# Patient Record
Sex: Male | Born: 1985 | Hispanic: No | Marital: Single | Smoking: Never smoker
Health system: Southern US, Community
[De-identification: ages and names within clinical notes are randomized; demographics above are authoritative.]

---
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
## 2010-07-29 ENCOUNTER — Emergency Department (HOSPITAL_COMMUNITY): Payer: Self-pay

## 2010-07-29 ENCOUNTER — Encounter (HOSPITAL_COMMUNITY): Payer: Self-pay

## 2010-07-29 ENCOUNTER — Emergency Department (HOSPITAL_COMMUNITY)
Admission: EM | Admit: 2010-07-29 | Discharge: 2010-07-29 | Disposition: A | Discharge status: Home / Self Care | Payer: Self-pay | Attending: Emergency Medicine | Admitting: Emergency Medicine

## 2010-07-29 DIAGNOSIS — M25519 Pain in unspecified shoulder: Secondary | ICD-10-CM | POA: Insufficient documentation

## 2010-07-29 DIAGNOSIS — S42009A Fracture of unspecified part of unspecified clavicle, initial encounter for closed fracture: Secondary | ICD-10-CM | POA: Insufficient documentation

## 2012-02-09 ENCOUNTER — Emergency Department (HOSPITAL_COMMUNITY)
Admission: EM | Admit: 2012-02-09 | Discharge: 2012-02-09 | Disposition: A | Discharge status: Home / Self Care | Payer: No Typology Code available for payment source | Attending: Emergency Medicine | Admitting: Emergency Medicine

## 2012-02-09 ENCOUNTER — Encounter (HOSPITAL_COMMUNITY): Payer: Self-pay | Admitting: Emergency Medicine

## 2012-02-09 DIAGNOSIS — S20219A Contusion of unspecified front wall of thorax, initial encounter: Secondary | ICD-10-CM | POA: Insufficient documentation

## 2012-02-09 DIAGNOSIS — Y92410 Unspecified street and highway as the place of occurrence of the external cause: Secondary | ICD-10-CM | POA: Insufficient documentation

## 2012-02-09 DIAGNOSIS — T148XXA Other injury of unspecified body region, initial encounter: Secondary | ICD-10-CM | POA: Insufficient documentation
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
## 2013-08-13 ENCOUNTER — Encounter (HOSPITAL_COMMUNITY): Payer: Self-pay | Admitting: Emergency Medicine

## 2013-08-13 ENCOUNTER — Emergency Department (HOSPITAL_COMMUNITY): Payer: 59

## 2013-08-13 ENCOUNTER — Emergency Department (HOSPITAL_COMMUNITY)
Admission: EM | Admit: 2013-08-13 | Discharge: 2013-08-14 | Disposition: A | Discharge status: Home / Self Care | Payer: 59 | Attending: Emergency Medicine | Admitting: Emergency Medicine

## 2013-08-13 DIAGNOSIS — Y9389 Activity, other specified: Secondary | ICD-10-CM | POA: Insufficient documentation

## 2013-08-13 DIAGNOSIS — Y990 Civilian activity done for income or pay: Secondary | ICD-10-CM | POA: Insufficient documentation

## 2013-08-13 DIAGNOSIS — W230XXA Caught, crushed, jammed, or pinched between moving objects, initial encounter: Secondary | ICD-10-CM | POA: Insufficient documentation

## 2013-08-13 DIAGNOSIS — S6000XA Contusion of unspecified finger without damage to nail, initial encounter: Secondary | ICD-10-CM | POA: Insufficient documentation

## 2013-08-13 DIAGNOSIS — S60011A Contusion of right thumb without damage to nail, initial encounter: Secondary | ICD-10-CM

## 2013-08-13 DIAGNOSIS — W268XXA Contact with other sharp object(s), not elsewhere classified, initial encounter: Secondary | ICD-10-CM | POA: Insufficient documentation

## 2013-08-13 DIAGNOSIS — Y9289 Other specified places as the place of occurrence of the external cause: Secondary | ICD-10-CM | POA: Insufficient documentation

## 2013-08-13 DIAGNOSIS — S61209A Unspecified open wound of unspecified finger without damage to nail, initial encounter: Secondary | ICD-10-CM | POA: Insufficient documentation

## 2013-08-13 NOTE — ED Provider Notes (Signed)
CSN: [id number]     Arrival date & time 08/13/13  2154 History  This chart was scribed for non-physician practitioner working with Audree CamelScott T Goldston, MD by Elveria Risingimelie Horne, ED Scribe. This patient was seen in room WTR6/WTR6 and the patient's care was started at 11:20 PM.   Chief Complaint  Patient presents with  . Finger Injury      The history is provided by the patient. No language interpreter was used.   HPI Comments: Henry Armstrong is a 28 y.o. male who presents to the Emergency Department with a right thumb injury. Patient reports smashing his thumb with a pry bar while at work yesterday. Patient says that he has not taken any medication, but he iced the finger while at work today. Patient and girlfriend report several attempts to drain blood from the nail w/ sterilized needle, but the blood under surface continued to reappear and at greater amounts. The finger is now swollen and painful. Patient says he feels pressure when naturally hanging his arm. Patient also reports numbness and tingling in the finger.  No medical issues.  History reviewed. No pertinent past medical history. History reviewed. No pertinent past surgical history. History reviewed. No pertinent family history. History  Substance Use Topics  . Smoking status: Never Smoker   . Smokeless tobacco: Not on file  . Alcohol Use: Yes     Comment: occ    Review of Systems  All other systems reviewed and are negative.     Allergies  Review of patient's allergies indicates no known allergies.  Home Medications   Prior to Admission medications   Not on File   Triage Vitals: BP 142/81  Pulse 72  Temp(Src) 97.8 F (36.6 C) (Oral)  SpO2 98% Physical Exam  Nursing note and vitals reviewed. Constitutional: He is oriented to person, place, and time. He appears well-developed and well-nourished. No distress.  HENT:  Head: Normocephalic and atraumatic.  Eyes:  Normal appearance  Neck: Normal range of motion.   Pulmonary/Chest: Effort normal.  Musculoskeletal: Normal range of motion.  Mild edema distal phalanx R thumb, particularly at proximal nail fold.  Entire phalanx severely ttp w/ guarding.  Subungal hematoma.  3 tiny puncture wounds at base of nail.  Distal sensation intact.   Neurological: He is alert and oriented to person, place, and time.  Psychiatric: He has a normal mood and affect. His behavior is normal.    ED Course  Procedures (including critical care time) DIAGNOSTIC STUDIES: Oxygen Saturation is 98% on room air, normal by my interpretation.    COORDINATION OF CARE: 11:28 PM- Will refer to hand specialist. Discussed treatment plan with patient at bedside and patient agreed to plan.     Labs Review Labs Reviewed - No data to display  Imaging Review Dg Finger Thumb Right  08/13/2013   CLINICAL DATA:  Smashed right thumb at work, pain distal right thumb with bruising under the thumbnail  EXAM: RIGHT THUMB 2+V  COMPARISON:  None.  FINDINGS: There is no evidence of fracture or dislocation. There is no evidence of arthropathy or other focal bone abnormality. Soft tissues are unremarkable  IMPRESSION: Negative.   Electronically Signed   By: Elige KoHetal  Patel   On: 08/13/2013 22:58     EKG Interpretation None      MDM   Final diagnoses:  Contusion of right thumb    [name] presents w/ injury to R thumb.  There is mild edema and tenderness of proximal phalanx,  particularly proximal nail fold, as well as a subungal hematoma.  Xray neg for fx/dislocation. Trephination performed w/ drainage of lg amt of blood.  Pressure/pain improved.  I recommended ice, elevation, prn tylenol/motrin.  Referred to hand for recurrent/worsening sx.   I personally performed the services described in this documentation, which was scribed in my presence. The recorded information has been reviewed and is accurate.    Otilio Miuatherine E Simuel Stebner, PA-C 08/13/13 [phone]

## 2013-08-13 NOTE — Discharge Instructions (Signed)
Take tylenol or motrin as needed for pain.  Apply ice to thumb and elevate whenever possible.  Follow up with Dr. Merlyn LotKuzma if you have increasing pain or if nail falls off.  Return to the ER if you develop fever, worsening pain/swelling or drainage of pus from wound on nail.

## 2013-08-13 NOTE — ED Notes (Signed)
Pt states that yesterday he smashed his R thumb yesterday in a car door and now it is swollen and painful. Alert and oriented.

## 2013-08-14 NOTE — ED Provider Notes (Signed)
Medical screening examination/treatment/procedure(s) were performed by non-physician practitioner and as supervising physician I was immediately available for consultation/collaboration.   EKG Interpretation None        Layla MawKristen N Ward, DO 08/14/13 0302

## 2019-09-24 ENCOUNTER — Ambulatory Visit
Admission: EM | Admit: 2019-09-24 | Discharge: 2019-09-24 | Disposition: A | Discharge status: Home / Self Care | Payer: Self-pay | Attending: Physician Assistant | Admitting: Physician Assistant

## 2019-09-24 ENCOUNTER — Other Ambulatory Visit: Payer: Self-pay

## 2019-09-24 DIAGNOSIS — J3489 Other specified disorders of nose and nasal sinuses: Secondary | ICD-10-CM

## 2019-09-24 DIAGNOSIS — R0981 Nasal congestion: Secondary | ICD-10-CM

## 2019-09-24 DIAGNOSIS — R059 Cough, unspecified: Secondary | ICD-10-CM

## 2019-09-24 MED ORDER — FLUTICASONE PROPIONATE 50 MCG/ACT NA SUSP: 2.0000 | Freq: Every day | NASAL | 0 refills | Status: DC

## 2019-09-24 MED ORDER — IPRATROPIUM BROMIDE 0.06 % NA SOLN: 2.0000 | Freq: Four times a day (QID) | NASAL | 0 refills | Status: DC

## 2019-09-24 MED ORDER — BENZONATATE 200 MG PO CAPS: 200.0000 mg | ORAL_CAPSULE | Freq: Three times a day (TID) | ORAL | 0 refills | Status: DC

## 2019-09-24 NOTE — Discharge Instructions (Addendum)
Please quarantine until symptoms improve. Tessalon for cough. Start flonase, atrovent nasal spray for nasal congestion/drainage. You can use over the counter nasal saline rinse such as neti pot for nasal congestion. Keep hydrated, your urine should be clear to pale yellow in color. Tylenol/motrin for fever and pain. Monitor for any worsening of symptoms, chest pain, shortness of breath, wheezing, swelling of the throat, go to the emergency department for further evaluation needed.

## 2019-09-24 NOTE — ED Provider Notes (Signed)
[facility]    CSN: [id number] Arrival date & time: 09/24/19  1851      History   Chief Complaint Chief Complaint  Patient presents with  . Cough    HPI Henry Armstrong is a 34 y.o. male.   34 year old male comes in for 4 day of URI symptoms. Rhinorrhea, nasal congestion, cough, sore throat. Denies fever, chills, body aches. Denies abdominal pain, nausea, vomiting, diarrhea. Denies shortness of breath, loss of taste/smell. Never smoker. Tylenol. 2 rapid covid testing was negative. No sick contact. No COVID vaccine.      History reviewed. No pertinent past medical history.  There are no problems to display for this patient.   History reviewed. No pertinent surgical history.     Home Medications    Prior to Admission medications   Medication Sig Start Date End Date Taking? Authorizing Provider  benzonatate (TESSALON) 200 MG capsule Take 1 capsule (200 mg total) by mouth every 8 (eight) hours. 09/24/19   Tasia Catchings, Aaronmichael Brumbaugh V, PA-C  fluticasone (FLONASE) 50 MCG/ACT nasal spray Place 2 sprays into both nostrils daily. 09/24/19   Tasia Catchings, Tajai Suder V, PA-C  ipratropium (ATROVENT) 0.06 % nasal spray Place 2 sprays into both nostrils 4 (four) times daily. 09/24/19   Ok Edwards, PA-C    Family History History reviewed. No pertinent family history.  Social History Social History   Tobacco Use  . Smoking status: Never Smoker  . Smokeless tobacco: Never Used  Substance Use Topics  . Alcohol use: Yes    Comment: occ  . Drug use: No     Allergies   Patient has no known allergies.   Review of Systems Review of Systems  Reason unable to perform ROS: See HPI as above.     Physical Exam Triage Vital Signs ED Triage Vitals  Enc Vitals Group     BP 09/24/19 1858 (!) 173/91     Pulse Rate 09/24/19 1858 86     Resp 09/24/19 1858 16     Temp 09/24/19 1858 98.1 F (36.7 C)     Temp Source 09/24/19 1858 Oral     SpO2 09/24/19 1858 97 %     Weight --      Height --      Head  Circumference --      Peak Flow --      Pain Score 09/24/19 1907 4     Pain Loc --      Pain Edu? --      Excl. in [place]? --    No data found.  Updated Vital Signs BP (!) 173/91 (BP Location: Left Arm)   Pulse 86   Temp 98.1 F (36.7 C) (Oral)   Resp 16   SpO2 97%   Visual Acuity Right Eye Distance:   Left Eye Distance:   Bilateral Distance:    Right Eye Near:   Left Eye Near:    Bilateral Near:     Physical Exam Constitutional:      General: He is not in acute distress.    Appearance: He is well-developed. He is not ill-appearing, toxic-appearing or diaphoretic.  HENT:     Head: Normocephalic and atraumatic.     Right Ear: Tympanic membrane, ear canal and external ear normal. Tympanic membrane is not erythematous or bulging.     Left Ear: Tympanic membrane, ear canal and external ear normal. Tympanic membrane is not erythematous or bulging.     Nose: Rhinorrhea present.  Right Sinus: No maxillary sinus tenderness or frontal sinus tenderness.     Left Sinus: No maxillary sinus tenderness or frontal sinus tenderness.     Mouth/Throat:     Mouth: Mucous membranes are moist.     Pharynx: Oropharynx is clear. Uvula midline.  Eyes:     Conjunctiva/sclera: Conjunctivae normal.     Pupils: Pupils are equal, round, and reactive to light.  Cardiovascular:     Rate and Rhythm: Normal rate and regular rhythm.  Pulmonary:     Effort: Pulmonary effort is normal. No accessory muscle usage, prolonged expiration, respiratory distress or retractions.     Breath sounds: No decreased air movement or transmitted upper airway sounds. No decreased breath sounds.     Comments: LCTAB Musculoskeletal:     Cervical back: Normal range of motion and neck supple.  Skin:    General: Skin is warm and dry.  Neurological:     Mental Status: He is alert and oriented to person, place, and time.      UC Treatments / Results  Labs (all labs ordered are listed, but only abnormal results are  displayed) Labs Reviewed - No data to display  EKG   Radiology No results found.  Procedures Procedures (including critical care time)  Medications Ordered in UC Medications - No data to display  Initial Impression / Assessment and Plan / UC Course  I have reviewed the triage vital signs and the nursing notes.  Pertinent labs & imaging results that were available during my care of the patient were reviewed by me and considered in my medical decision making (see chart for details).    Discussed possible false negative with rapid COVID, offered PCR testing, for which patient declined. Symptomatic treatment as needed. Push fluids. Return precautions given.    Final Clinical Impressions(s) / UC Diagnoses   Final diagnoses:  Rhinorrhea  Nasal congestion  Cough    ED Prescriptions    Medication Sig Dispense Auth. Provider   fluticasone (FLONASE) 50 MCG/ACT nasal spray Place 2 sprays into both nostrils daily. 1 g Reida Hem V, PA-C   ipratropium (ATROVENT) 0.06 % nasal spray Place 2 sprays into both nostrils 4 (four) times daily. 15 mL Mendel Binsfeld V, PA-C   benzonatate (TESSALON) 200 MG capsule Take 1 capsule (200 mg total) by mouth every 8 (eight) hours. 21 capsule Belinda Fisher, PA-C     PDMP not reviewed this encounter.   Belinda Fisher, PA-C 09/24/19 1939

## 2019-09-24 NOTE — ED Triage Notes (Signed)
Pt c/o productive cough with green sputum, runny nose, sore throat, and body aches x4 days. States has had 2 neg rapid covid test.

## 2019-11-06 ENCOUNTER — Ambulatory Visit (INDEPENDENT_AMBULATORY_CARE_PROVIDER_SITE_OTHER): Payer: Self-pay

## 2019-11-06 ENCOUNTER — Ambulatory Visit (INDEPENDENT_AMBULATORY_CARE_PROVIDER_SITE_OTHER): Payer: Self-pay | Admitting: Orthopedic Surgery

## 2019-11-06 DIAGNOSIS — M25511 Pain in right shoulder: Secondary | ICD-10-CM

## 2019-11-06 DIAGNOSIS — M792 Neuralgia and neuritis, unspecified: Secondary | ICD-10-CM

## 2019-11-06 DIAGNOSIS — M79601 Pain in right arm: Secondary | ICD-10-CM

## 2019-11-06 DIAGNOSIS — M62521 Muscle wasting and atrophy, not elsewhere classified, right upper arm: Secondary | ICD-10-CM

## 2019-11-06 DIAGNOSIS — M958 Other specified acquired deformities of musculoskeletal system: Secondary | ICD-10-CM

## 2019-11-10 ENCOUNTER — Encounter: Payer: Self-pay | Admitting: Orthopedic Surgery

## 2019-11-10 NOTE — Progress Notes (Signed)
Office Visit Note   Patient: Henry Armstrong           Date of Birth: 1985-07-10           MRN: [id number] Visit Date: 11/06/2019 Requested by: Deatra James, MD [phone] [address] [place],  [place] [address] PCP: Patient, No Pcp Per  Subjective: Chief Complaint  Patient presents with  . Right Shoulder - Pain    HPI: [place] Henry Armstrong is a 34 y.o. male who presents to the office complaining of right shoulder pain.  Patient notes pain over the last 3 months.  Denies any acute injury leading to onset of pain.  He notes constant pain that he localizes to the right shoulder with radiation down the arm.  He has numbness and tingling that runs from his elbow down to his hand on occasion.  He denies any neck symptoms or neck pain.  He has no history of shoulder or neck surgery.  He is right-hand dominant.  He does have some concerns over the loss of size of the muscles through the right arm compared with the left arm.  He works out quite a bit, typically 5 days a week.  He is unable to lay on his right side at night.  He has tried Mobic which has provided relief.  Has never had a shoulder injection.  Denies any mechanical symptoms or weakness in the arm..                ROS:  All systems reviewed are negative as they relate to the chief complaint within the history of present illness.  Patient denies fevers or chills.  Assessment & Plan: Visit Diagnoses:  1. Atrophy of muscle of right upper arm   2. Right shoulder pain, unspecified chronicity   3. Right arm pain   4. Radicular pain in right arm   5. Winging of scapula     Plan: Patient is a 34 year old male who presents complaining of right shoulder and radicular arm pain.  He has had pain over the last 3 months.  He works out quite a bit, 5 days a week.  Right shoulder radiographs are negative for any findings to explain his pain and symptoms.  He does have atrophy of the right arm musculature and subtle scapular winging on the right side  that is not present on the left side.  Differential diagnosis includes cervical disc pathology versus peripheral neuropathy versus peripheral nerve palsy.  Ordered radiographs of the cervical spine which did reveal degenerative changes throughout multiple levels of the C-spine.  Ordered MRI of the cervical spine for further evaluation.  Additionally referred patient to Dr. Naaman Plummer for nerve conduction study of the right upper extremity.  Patient agreed with this plan.  Follow-up after studies to review results.  Recommended that he reduce his gym time to about twice a week.  Follow-Up Instructions: No follow-ups on file.   Orders:  Orders Placed This Encounter  Procedures  . XR Shoulder Right  . XR Cervical Spine 2 or 3 views  . MR Cervical Spine w/o contrast  . Ambulatory referral to Physical Medicine Rehab   No orders of the defined types were placed in this encounter.     Procedures: No procedures performed   Clinical Data: No additional findings.  Objective: Vital Signs: There were no vitals taken for this visit.  Physical Exam:  Constitutional: Patient appears well-developed HEENT:  Head: Normocephalic Eyes:EOM are normal Neck: Normal range of  motion Cardiovascular: Normal rate Pulmonary/chest: Effort normal Neurologic: Patient is alert Skin: Skin is warm Psychiatric: Patient has normal mood and affect  Ortho Exam:  Ortho exam demonstrates mild atrophy throughout the right arm compared to the contralateral side.  About 1 to 2 mm atrophy of the biceps compared with the left side.  Atrophy of the forearm musculature as well.  Scapular winging is present on the right side.  No tenderness to palpation throughout the axial cervical spine.  No pain with cervical spine range of motion.  5/5 motor strength of grip, finger abduction, pronation, supination, bicep, tricep, deltoid.  5/5 motor strength of subscapularis, infraspinatus, supraspinatus.  No tenderness to palpation  over the [facility] joint.  Mild tenderness palpation over the bicipital groove.  Well-preserved range of motion of the right shoulder.  Specialty Comments:  No specialty comments available.  Imaging: No results found.   PMFS History: There are no problems to display for this patient.  No past medical history on file.  No family history on file.  No past surgical history on file. Social History   Occupational History  . Not on file  Tobacco Use  . Smoking status: Never Smoker  . Smokeless tobacco: Never Used  Substance and Sexual Activity  . Alcohol use: Yes    Comment: occ  . Drug use: No  . Sexual activity: Not on file

## 2019-11-24 ENCOUNTER — Ambulatory Visit
Admission: RE | Admit: 2019-11-24 | Discharge: 2019-11-24 | Disposition: A | Discharge status: Home / Self Care | Payer: Self-pay | Source: Ambulatory Visit | Attending: Orthopedic Surgery | Admitting: Orthopedic Surgery

## 2019-11-24 ENCOUNTER — Other Ambulatory Visit: Payer: Self-pay

## 2019-11-24 DIAGNOSIS — M79601 Pain in right arm: Secondary | ICD-10-CM

## 2019-11-27 ENCOUNTER — Ambulatory Visit: Payer: Self-pay | Admitting: Orthopedic Surgery

## 2019-12-02 ENCOUNTER — Ambulatory Visit (INDEPENDENT_AMBULATORY_CARE_PROVIDER_SITE_OTHER): Payer: Self-pay | Admitting: Orthopedic Surgery

## 2019-12-02 ENCOUNTER — Encounter: Payer: Self-pay | Admitting: Orthopedic Surgery

## 2019-12-02 DIAGNOSIS — M7551 Bursitis of right shoulder: Secondary | ICD-10-CM

## 2019-12-02 DIAGNOSIS — M4802 Spinal stenosis, cervical region: Secondary | ICD-10-CM

## 2019-12-02 NOTE — Progress Notes (Signed)
Office Visit Note   Patient: Henry Armstrong           Date of Birth: Dec 11, 1985           MRN: [id number] Visit Date: 12/02/2019 Requested by: No referring provider defined for this encounter. PCP: Patient, No Pcp Per  Subjective: Chief Complaint  Patient presents with  . Follow-up    HPI: Henry Armstrong is a 34 y.o. male who presents to the office complaining of right shoulder and right upper extremity pain.  Patient returns to discuss MRI results of the cervical spine MRI.  He notes that he continues to wake with primarily right shoulder pain.  He notes continued atrophy of the right upper extremity muscles slightly compared with the contralateral side.  He does note radicular arm pain that travels from his shoulder down into his forearm.  He denies any significant neck pain.  He is scheduled for right upper extremity nerve conduction study with Dr. Alvester Morin on September 10.  Patient notes the majority of his pain comes from his shoulder..                ROS: All systems reviewed are negative as they relate to the chief complaint within the history of present illness.  Patient denies fevers or chills.  Assessment & Plan: Visit Diagnoses:  1. Bursitis of right shoulder   2. Foraminal stenosis of cervical region     Plan: Patient is a 34 year old male who presents complaining of right shoulder pain and right arm radicular pain.  He is currently scheduled for right upper extremity nerve conduction study on September 10.  MRI of the cervical spine was reviewed and revealed moderate spinal canal and bilateral neuroforaminal narrowing at C5-C6 with moderate right C6-C7 neural foraminal narrowing and mild C4-C5 and C6-C7 spinal canal narrowing.  He does have some atrophy of the right upper extremity muscles compared with the contralateral side.  Plan to refer patient to Dr. Naaman Plummer for cervical spine epidural steroid injections.  Encourage patient to continue to follow-up with Dr. Alvester Morin for  nerve conduction study in September as well.  It does seem that a lot of patient's pain is stemming from the shoulder joint in addition to some sort of nerve related radicular pain.  The radicular pain is likely coming from his neck or peripheral nerve impingement while the shoulder pain itself that wakes him up at night seems to be bursitis judging by examination today.  Plan to try a subacromial right shoulder injection today.  Recommended patient be attention to his symptoms to see how much his symptoms improved from the subacromial injection as compared with the injections he received for his cervical spine.  Patient understands.  He tolerated the procedure well.  His primary concern is the atrophy of the biceps muscle which I do not have a great explanation for at this time.  I think a nerve conduction study could be helpful in that regard.  We will see what that shows and then plan intervention accordingly from there.  Follow-Up Instructions: No follow-ups on file.   Orders:  No orders of the defined types were placed in this encounter.  No orders of the defined types were placed in this encounter.     Procedures: Large Joint Inj: R subacromial bursa on 12/06/2019 7:27 AM Indications: diagnostic evaluation and pain Details: 18 G 1.5 in needle, posterior approach  Arthrogram: No  Medications: 9 mL bupivacaine 0.5 %; 40 mg methylPREDNISolone acetate 40 MG/ML;  5 mL lidocaine 1 % Outcome: tolerated well, no immediate complications Procedure, treatment alternatives, risks and benefits explained, specific risks discussed. Consent was given by the patient. Immediately prior to procedure a time out was called to verify the correct patient, procedure, equipment, support staff and site/side marked as required. Patient was prepped and draped in the usual sterile fashion.       Clinical Data: No additional findings.  Objective: Vital Signs: There were no vitals taken for this visit.  Physical  Exam:  Constitutional: Patient appears well-developed HEENT:  Head: Normocephalic Eyes:EOM are normal Neck: Normal range of motion Cardiovascular: Normal rate Pulmonary/chest: Effort normal Neurologic: Patient is alert Skin: Skin is warm Psychiatric: Patient has normal mood and affect  Ortho Exam: Orthopedic exam demonstrates mild tenderness palpation throughout the axial cervical spine.  5/5 motor strength of the bilateral grip, abduction, pronation/supination, bicep, tricep, infraspinatus, supraspinatus, subscapularis.  Mild pain with subscapularis resistance testing.  Positive Neer and Hawkins impingement tests.  No crepitus felt with passive range of motion of the right shoulder.  Specialty Comments:  No specialty comments available.  Imaging: No results found.   PMFS History: There are no problems to display for this patient.  No past medical history on file.  No family history on file.  No past surgical history on file. Social History   Occupational History  . Not on file  Tobacco Use  . Smoking status: Never Smoker  . Smokeless tobacco: Never Used  Substance and Sexual Activity  . Alcohol use: Yes    Comment: occ  . Drug use: No  . Sexual activity: Not on file

## 2019-12-06 DIAGNOSIS — M7551 Bursitis of right shoulder: Secondary | ICD-10-CM

## 2019-12-06 MED ORDER — LIDOCAINE HCL 1 % IJ SOLN
5.0000 mL | INTRAMUSCULAR | Status: AC | PRN
  Administered 2019-12-06: 5 mL

## 2019-12-06 MED ORDER — BUPIVACAINE HCL 0.5 % IJ SOLN
9.0000 mL | INTRAMUSCULAR | Status: AC | PRN
  Administered 2019-12-06: 9 mL via INTRA_ARTICULAR

## 2019-12-06 MED ORDER — METHYLPREDNISOLONE ACETATE 40 MG/ML IJ SUSP
40.0000 mg | INTRAMUSCULAR | Status: AC | PRN
  Administered 2019-12-06: 40 mg via INTRA_ARTICULAR

## 2020-01-10 ENCOUNTER — Ambulatory Visit: Payer: Self-pay | Admitting: Physical Medicine and Rehabilitation

## 2020-01-10 ENCOUNTER — Other Ambulatory Visit: Payer: Self-pay

## 2020-01-10 ENCOUNTER — Encounter: Payer: Self-pay | Admitting: Physical Medicine and Rehabilitation

## 2020-01-10 DIAGNOSIS — R202 Paresthesia of skin: Secondary | ICD-10-CM

## 2020-01-10 NOTE — Progress Notes (Signed)
Pain in right shoulder and upper back. Muscle atrophy in right upper arm. Symptoms for 4-5 months. Right hand dominant. Numeric Pain Rating Scale and Functional Assessment Average Pain 8   In the last MONTH (on 0-10 scale) has pain interfered with the following?  1. General activity like being  able to carry out your everyday physical activities such as walking, climbing stairs, carrying groceries, or moving a chair?  Rating(8)

## 2020-01-14 NOTE — Progress Notes (Signed)
Arty Baumgartnerdrian Orf - 34 y.o. male MRN [id number]  Date of birth: 1985/08/11  Office Visit Note: Visit Date: 01/10/2020 PCP: Patient, No Pcp Per Referred by: Cammy Copaean, Gregory Scott, MD  Subjective: Chief Complaint  Patient presents with   Right Shoulder - Pain   HPI:  Arty Baumgartnerdrian Mcguffin is a 34 y.o. male who comes in today At the request of Dr. Burnard BuntingG. Scott Dean for electrodiagnostic study of the right upper limb.  Patient is complaining of 5 months of significant right shoulder pain mostly constant but somewhat worse with mechanical movement of the shoulder.  Reports that shoulder injection helped for a few days and he felt like it was sort of significant at first but does not really report much in the way of relief with that.  His other big complaint is what he feels is his right bicep is atrophied compared to his left.  He does lift weights and workout about 5 days a week.  He has not really noticed much in the way of strength loss however.  He reports a lot of pain with laying on the right side.  He does have some paresthesia in the arm down to the hand.  He denies much in the way of neck pain at all.  He rates his pain in the shoulder and arm as an 8 out of 10.  He denies any left-sided complaints.  On exam Dr. August Saucerean had noticed mild scapular winging on the right.  He has not had electrodiagnostic studies performed before.  He has had cervical spine MRI which is reviewed below which does show right paracentral disc at C5-6 with significant spondylosis and some stenosis for his age.  ROS Otherwise per HPI.  Assessment & Plan: Visit Diagnoses:  1. Paresthesia of skin     Plan: Impression: Essentially NORMAL electrodiagnostic study of the right upper limb.  There is no significant electrodiagnostic evidence of nerve entrapment, brachial plexopathy or cervical radiculopathy.    **As you know, purely sensory or demyelinating radiculopathies and chemical radiculitis may not be detected with this particular  electrodiagnostic study.  **Complaints of biceps right atrophy and mild right scapula winging with normal EMG is somewhat confounding. Electrodiagnostic study of the long thoracic nerve could be done at a more specific EMG lab but this would not explain totality of symptoms. The cervical stenosis is more than normal for his age and may be part of the etiology.  He also endorsed mechanical complaints of the shoulder.  Recommendations: 1.  Follow-up with referring physician. 2.  Continue current management of symptoms. Could consider epidural injection. Continue PT.   Meds & Orders: No orders of the defined types were placed in this encounter.   Orders Placed This Encounter  Procedures   NCV with EMG (electromyography)    Follow-up: Return for  Burnard BuntingG. Scott Dean, M.D..   Procedures: No procedures performed  Impression: Essentially NORMAL electrodiagnostic study of the right upper limb.  There is no significant electrodiagnostic evidence of nerve entrapment, brachial plexopathy or cervical radiculopathy.    **As you know, purely sensory or demyelinating radiculopathies and chemical radiculitis may not be detected with this particular electrodiagnostic study.  **Complaints of biceps right atrophy and mild right scapula winging with normal EMG is somewhat confounding. Electrodiagnostic study of the long thoracic nerve could be done at a more specific EMG lab but this would not explain totality of symptoms. The cervical stenosis is more than normal for his age and may be part of the  etiology.  He also endorsed mechanical complaints of the shoulder.  Recommendations: 1.  Follow-up with referring physician. 2.  Continue current management of symptoms. Could consider epidural injection. Continue PT.   ___________________________ Naaman Plummer FAAPMR Board Certified, American Board of Physical Medicine and Rehabilitation    Nerve Conduction Studies Anti Sensory Summary Table   Stim Site NR  Peak (ms) Norm Peak (ms) P-T Amp (V) Norm P-T Amp Site1 Site2 Delta-P (ms) Dist (cm) Vel (m/s) Norm Vel (m/s)  Right Median Acr Palm Anti Sensory (2nd Digit)  29.8C  Wrist    3.6 <3.6 24.8 >10 Wrist Palm 1.6 0.0    Palm    2.0 <2.0 29.5         Right Radial Anti Sensory (Base 1st Digit)  29.5C  Wrist    2.2 <3.1 31.4  Wrist Base 1st Digit 2.2 0.0    Right Ulnar Anti Sensory (5th Digit)  30C  Wrist    3.4 <3.7 *13.2 >15.0 Wrist 5th Digit 3.4 14.0 41 >38   Motor Summary Table   Stim Site NR Onset (ms) Norm Onset (ms) O-P Amp (mV) Norm O-P Amp Site1 Site2 Delta-0 (ms) Dist (cm) Vel (m/s) Norm Vel (m/s)  Right Median Motor (Abd Poll Brev)  29.8C  Wrist    4.1 <4.2 7.4 >5 Elbow Wrist 4.4 23.5 53 >50  Elbow    8.5  7.1         Right Radial Motor (Ext Indicis)  29.1C  8cm    2.4 <2.5 3.0 >1.7 Up Arm 8cm 3.6 22.0 61 >60  Up Arm    6.0  7.1         Right Ulnar Motor (Abd Dig Min)  29.6C  Wrist    3.3 <4.2 13.5 >3 B Elbow Wrist 4.0 21.5 54 >53  B Elbow    7.3  11.8  A Elbow B Elbow 1.1 10.0 91 >53  A Elbow    8.4  11.7          EMG   Side Muscle Nerve Root Ins Act Fibs Psw Amp Dur Poly Recrt Int Dennie Bible Comment  Right 1stDorInt Ulnar C8-T1 Nml Nml Nml Nml Nml 0 Nml Nml   Right Abd Poll Brev Median C8-T1 Nml Nml Nml Nml Nml 0 Nml Nml   Right Triceps Radial C6-7-8 Nml Nml Nml Nml Nml 0 Nml Nml   Right Deltoid Axillary C5-6 Nml Nml Nml Nml Nml 0 Nml Nml   Right Trapezius SpinalAcc CN XI, C3-4 Nml Nml Nml Nml Nml 0 Nml Nml   Right SerratAnt LongThor C5-7 Nml Nml Nml Nml Nml 0 Nml Nml   Right Infraspinatus SupraScap C5-6 Nml Nml Nml Nml Nml 0 Nml Nml   Right Rhomboid Major DorsalScap C5 Nml Nml Nml Nml Nml 0 Nml Nml   Right Ext Digitorum  Radial (Post Int) C7-8 Nml Nml Nml Nml Nml 0 Nml Nml     Nerve Conduction Studies Anti Sensory Left/Right Comparison   Stim Site L Lat (ms) R Lat (ms) L-R Lat (ms) L Amp (V) R Amp (V) L-R Amp (%) Site1 Site2 L Vel (m/s) R Vel (m/s) L-R Vel (m/s)    Median Acr Palm Anti Sensory (2nd Digit)  29.8C  Wrist  3.6   24.8  Wrist Palm     Palm  2.0   29.5        Radial Anti Sensory (Base 1st Digit)  29.5C  Wrist  2.2   31.4  Wrist Base 1st Digit     Ulnar Anti Sensory (5th Digit)  30C  Wrist  3.4   *13.2  Wrist 5th Digit  41    Motor Left/Right Comparison   Stim Site L Lat (ms) R Lat (ms) L-R Lat (ms) L Amp (mV) R Amp (mV) L-R Amp (%) Site1 Site2 L Vel (m/s) R Vel (m/s) L-R Vel (m/s)  Median Motor (Abd Poll Brev)  29.8C  Wrist  4.1   7.4  Elbow Wrist  53   Elbow  8.5   7.1        Radial Motor (Ext Indicis)  29.1C  8cm  2.4   3.0  Up Arm 8cm  61   Up Arm  6.0   7.1        Ulnar Motor (Abd Dig Min)  29.6C  Wrist  3.3   13.5  B Elbow Wrist  54   B Elbow  7.3   11.8  A Elbow B Elbow  91   A Elbow  8.4   11.7           Waveforms:              Clinical History: MRI CERVICAL SPINE WITHOUT CONTRAST  TECHNIQUE: Multiplanar, multisequence MR imaging of the cervical spine was performed. No intravenous contrast was administered.  COMPARISON:  11/06/2019 cervical spine radiographs.  FINDINGS: Alignment: Straightening of cervical lordosis.  Vertebrae: Normal bone marrow signal intensity. No focal osseous lesion.  Cord: Normal signal and morphology. Please note that motion artifact limits evaluation.  Posterior Fossa, vertebral arteries: Negative.  Disc levels: Multilevel desiccation however disc spaces are grossly preserved.  C2-3: No significant disc bulge, spinal canal or neural foraminal narrowing.  C3-4: Disc bulge with small superimposed left subarticular/foraminal protrusion. Left uncovertebral and facet degenerative spurring. Mild left neural foraminal narrowing.  C4-5: Disc osteophyte complex with superimposed right paracentral protrusion partially effacing the ventral CSF containing spaces, uncovertebral and bilateral facet hypertrophy. Mild spinal canal and bilateral neural foraminal  narrowing.  C5-6: Disc osteophyte complex with superimposed right paracentral protrusion abutting the ventral cord and uncovertebral hypertrophy. Moderate spinal canal and bilateral neural foraminal narrowing.  C6-7: Disc osteophyte complex with superimposed central protrusion, uncovertebral and bilateral facet hypertrophy. Mild spinal canal, moderate right and mild left neural foraminal narrowing.  C7-T1: No significant disc bulge, spinal canal or neural foraminal narrowing.  Paraspinal tissues: Paraspinal soft tissues within normal limits. Right maxillary sinus disease.  IMPRESSION: Moderate spinal canal and bilateral neural foraminal narrowing at the C5-6 level.  Moderate right C6-7 neural foraminal narrowing.  Mild C4-5 and C6-7 spinal canal narrowing.   Electronically Signed   By: Stana Bunting M.D.   On: 11/25/2019 09:21     Objective:  VS:  HT:     WT:    BMI:      BP:    HR: bpm   TEMP: ( )   RESP:  Physical Exam Musculoskeletal:        General: No tenderness.     Cervical back: Normal range of motion and neck supple. No rigidity or tenderness.     Comments: Inspection reveals subjective decrease in muscle size of the right bicep compared to left but no atrophy of the bilateral trapezius or deltoid or parascapular muscles or APB or FDI or hand intrinsics.  There is mild winging of the right scapula which is medial winging really without even trying to get him to move the arm.  There is no swelling, color changes, allodynia or dystrophic changes. There is 5 out of 5 strength in the bilateral wrist extension, finger abduction and long finger flexion. There is intact sensation to light touch in all dermatomal and peripheral nerve distributions. There is a negative Hoffmann's test bilaterally.  Skin:    General: Skin is warm and dry.     Findings: No erythema or rash.  Neurological:     General: No focal deficit present.     Mental Status: He is alert and  oriented to person, place, and time.     Sensory: No sensory deficit.     Motor: No weakness or abnormal muscle tone.     Coordination: Coordination normal.     Gait: Gait normal.  Psychiatric:        Mood and Affect: Mood normal.        Behavior: Behavior normal.        Thought Content: Thought content normal.      Imaging: No results found.

## 2020-01-14 NOTE — Procedures (Signed)
Impression: Essentially NORMAL electrodiagnostic study of the right upper limb.  There is no significant electrodiagnostic evidence of nerve entrapment, brachial plexopathy or cervical radiculopathy.    **As you know, purely sensory or demyelinating radiculopathies and chemical radiculitis may not be detected with this particular electrodiagnostic study.  **Complaints of biceps right atrophy and mild right scapula winging with normal EMG is somewhat confounding. Electrodiagnostic study of the long thoracic nerve could be done at a more specific EMG lab but this would not explain totality of symptoms. The cervical stenosis is more than normal for his age and may be part of the etiology.  He also endorsed mechanical complaints of the shoulder.  Recommendations: 1.  Follow-up with referring physician. 2.  Continue current management of symptoms. Could consider epidural injection. Continue PT.   ___________________________ Naaman Plummer FAAPMR Board Certified, American Board of Physical Medicine and Rehabilitation    Nerve Conduction Studies Anti Sensory Summary Table   Stim Site NR Peak (ms) Norm Peak (ms) P-T Amp (V) Norm P-T Amp Site1 Site2 Delta-P (ms) Dist (cm) Vel (m/s) Norm Vel (m/s)  Right Median Acr Palm Anti Sensory (2nd Digit)  29.8C  Wrist    3.6 <3.6 24.8 >10 Wrist Palm 1.6 0.0    Palm    2.0 <2.0 29.5         Right Radial Anti Sensory (Base 1st Digit)  29.5C  Wrist    2.2 <3.1 31.4  Wrist Base 1st Digit 2.2 0.0    Right Ulnar Anti Sensory (5th Digit)  30C  Wrist    3.4 <3.7 *13.2 >15.0 Wrist 5th Digit 3.4 14.0 41 >38   Motor Summary Table   Stim Site NR Onset (ms) Norm Onset (ms) O-P Amp (mV) Norm O-P Amp Site1 Site2 Delta-0 (ms) Dist (cm) Vel (m/s) Norm Vel (m/s)  Right Median Motor (Abd Poll Brev)  29.8C  Wrist    4.1 <4.2 7.4 >5 Elbow Wrist 4.4 23.5 53 >50  Elbow    8.5  7.1         Right Radial Motor (Ext Indicis)  29.1C  8cm    2.4 <2.5 3.0 >1.7 Up Arm 8cm 3.6  22.0 61 >60  Up Arm    6.0  7.1         Right Ulnar Motor (Abd Dig Min)  29.6C  Wrist    3.3 <4.2 13.5 >3 B Elbow Wrist 4.0 21.5 54 >53  B Elbow    7.3  11.8  A Elbow B Elbow 1.1 10.0 91 >53  A Elbow    8.4  11.7          EMG   Side Muscle Nerve Root Ins Act Fibs Psw Amp Dur Poly Recrt Int Dennie Bible Comment  Right 1stDorInt Ulnar C8-T1 Nml Nml Nml Nml Nml 0 Nml Nml   Right Abd Poll Brev Median C8-T1 Nml Nml Nml Nml Nml 0 Nml Nml   Right Triceps Radial C6-7-8 Nml Nml Nml Nml Nml 0 Nml Nml   Right Deltoid Axillary C5-6 Nml Nml Nml Nml Nml 0 Nml Nml   Right Trapezius SpinalAcc CN XI, C3-4 Nml Nml Nml Nml Nml 0 Nml Nml   Right SerratAnt LongThor C5-7 Nml Nml Nml Nml Nml 0 Nml Nml   Right Infraspinatus SupraScap C5-6 Nml Nml Nml Nml Nml 0 Nml Nml   Right Rhomboid Major DorsalScap C5 Nml Nml Nml Nml Nml 0 Nml Nml   Right Ext Digitorum  Radial (Post Int) C7-8 Nml Nml  Nml Nml Nml 0 Nml Nml     Nerve Conduction Studies Anti Sensory Left/Right Comparison   Stim Site L Lat (ms) R Lat (ms) L-R Lat (ms) L Amp (V) R Amp (V) L-R Amp (%) Site1 Site2 L Vel (m/s) R Vel (m/s) L-R Vel (m/s)  Median Acr Palm Anti Sensory (2nd Digit)  29.8C  Wrist  3.6   24.8  Wrist Palm     Palm  2.0   29.5        Radial Anti Sensory (Base 1st Digit)  29.5C  Wrist  2.2   31.4  Wrist Base 1st Digit     Ulnar Anti Sensory (5th Digit)  30C  Wrist  3.4   *13.2  Wrist 5th Digit  41    Motor Left/Right Comparison   Stim Site L Lat (ms) R Lat (ms) L-R Lat (ms) L Amp (mV) R Amp (mV) L-R Amp (%) Site1 Site2 L Vel (m/s) R Vel (m/s) L-R Vel (m/s)  Median Motor (Abd Poll Brev)  29.8C  Wrist  4.1   7.4  Elbow Wrist  53   Elbow  8.5   7.1        Radial Motor (Ext Indicis)  29.1C  8cm  2.4   3.0  Up Arm 8cm  61   Up Arm  6.0   7.1        Ulnar Motor (Abd Dig Min)  29.6C  Wrist  3.3   13.5  B Elbow Wrist  54   B Elbow  7.3   11.8  A Elbow B Elbow  91   A Elbow  8.4   11.7           Waveforms:

## 2020-01-22 ENCOUNTER — Encounter: Payer: Self-pay | Admitting: Orthopedic Surgery

## 2020-01-22 ENCOUNTER — Ambulatory Visit (INDEPENDENT_AMBULATORY_CARE_PROVIDER_SITE_OTHER): Payer: Self-pay | Admitting: Orthopedic Surgery

## 2020-01-22 DIAGNOSIS — G8929 Other chronic pain: Secondary | ICD-10-CM

## 2020-01-22 DIAGNOSIS — M25511 Pain in right shoulder: Secondary | ICD-10-CM

## 2020-01-22 NOTE — Progress Notes (Signed)
Scapular winging   Office Visit Note   Patient: Henry Armstrong           Date of Birth: [birth year]           MRN: [id number] Visit Date: 01/22/2020 Requested by: No referring provider defined for this encounter. PCP: Patient, No Pcp Per  Subjective: Chief Complaint  Patient presents with  . Follow-up    HPI: Henry Armstrong is a patient with right arm weakness and subjective biceps atrophy.  Since have seen him he had an EMG nerve study which was normal.  Reports some weakness in the shoulder.  Not really able to work out like he wanted to and is used to doing.  Denies any neck pain or numbness and tingling.  Most of the pain is in the posterior scapular region.              ROS: All systems reviewed are negative as they relate to the chief complaint within the history of present illness.  Patient denies  fevers or chills.   Assessment & Plan: Visit Diagnoses:  1. Chronic right shoulder pain     Plan: Impression is right shoulder pain with subjective weakness and early scapular winging consistent with possible serratus anterior problem.  He has no cervical spine symptoms.  He had an MRI scan which showed moderate spinal canal and bilateral neuroforaminal narrowing at C5-6.  Moderate right-sided C6-7 neuroforaminal narrowing.  None of these really explain his scapular winging.  With normal nerve study I would favor observation and avoidance of things that might stretch long thoracic nerve.  Follow-up in 2 months for clinical recheck.  Could consider diagnostic cervical spine ESI at that time unless his symptoms change.  Follow-Up Instructions: Return in about 8 weeks (around 03/18/2020).   Orders:  No orders of the defined types were placed in this encounter.  No orders of the defined types were placed in this encounter.     Procedures: No procedures performed   Clinical Data: No additional findings.  Objective: Vital Signs: There were no vitals taken for this visit.  Physical  Exam:   Constitutional: Patient appears well-developed HEENT:  Head: Normocephalic Eyes:EOM are normal Neck: Normal range of motion Cardiovascular: Normal rate Pulmonary/chest: Effort normal Neurologic: Patient is alert Skin: Skin is warm Psychiatric: Patient has normal mood and affect    Ortho Exam: Ortho exam demonstrates full active and passive range of motion of the cervical spine.  Excellent grip EPL FPL interosseous wrist flexion extension bicep triceps and deltoid strength bilaterally with no real paresthesias C5-T1.  Does have some scapular winging consistent with serratus weakness.  Not hugely noticeable but is present.  Reflexes symmetric.  Specialty Comments:  No specialty comments available.  Imaging: No results found.   PMFS History: There are no problems to display for this patient.  History reviewed. No pertinent past medical history.  History reviewed. No pertinent family history.  History reviewed. No pertinent surgical history. Social History   Occupational History  . Not on file  Tobacco Use  . Smoking status: Never Smoker  . Smokeless tobacco: Never Used  Substance and Sexual Activity  . Alcohol use: Yes    Comment: occ  . Drug use: No  . Sexual activity: Not on file

## 2020-03-25 ENCOUNTER — Ambulatory Visit: Payer: Self-pay | Admitting: Orthopedic Surgery

## 2020-08-10 IMAGING — MR MR CERVICAL SPINE W/O CM
4 of 5 series · 29 of 48 positions shown · non-contrast
Comparison: 11/06/2019 cervical spine radiographs.

CLINICAL DATA: Cervical radiculopathy.

EXAM:
MRI CERVICAL SPINE WITHOUT CONTRAST
TECHNIQUE: Multiplanar, multisequence MR imaging of the cervical spine was
performed. No intravenous contrast was administered.

[Series 3: T2 · sagittal · 3.0mm · 0.66mm/px · 8 of 15 slices shown (1 of 2)]
[im 1/15]
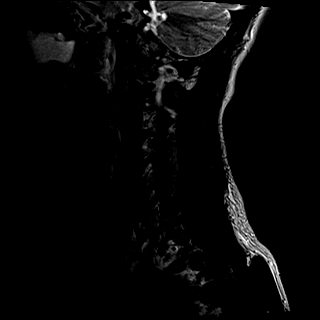
[im 3/15]
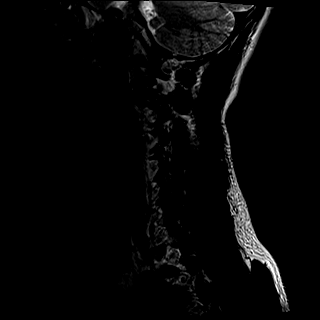
[im 5/15]
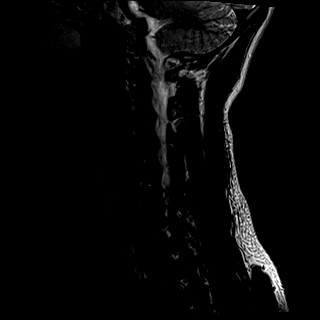
[im 7/15]
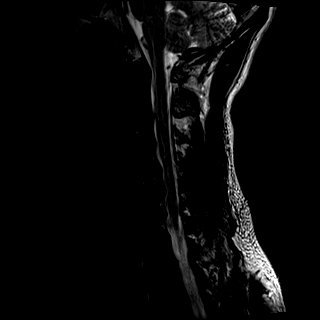
[im 9/15]
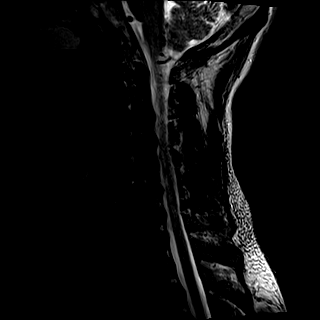
[im 11/15]
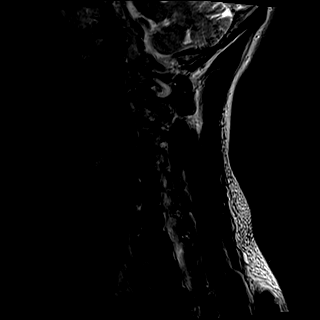
[im 13/15]
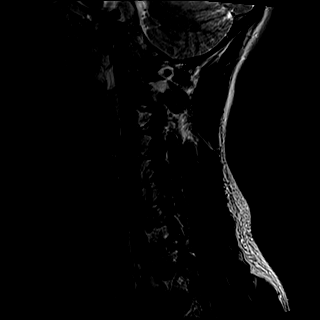
[im 15/15]
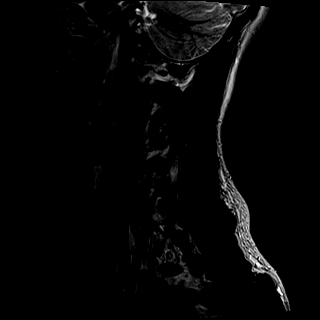

[Series 5: tir sag · sagittal · 3.0mm · 0.41mm/px · 5 of 15 slices shown]
[im 1/15]
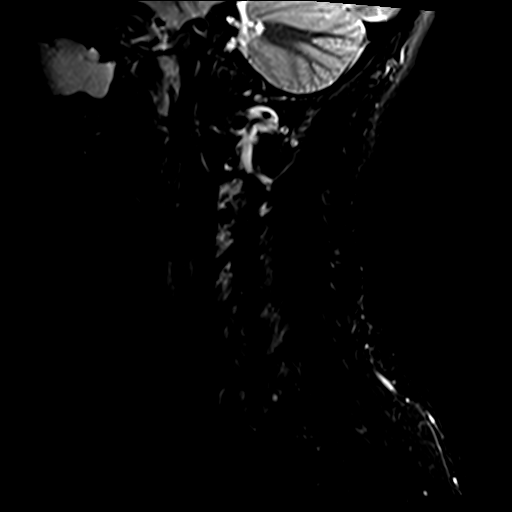
[im 3/15]
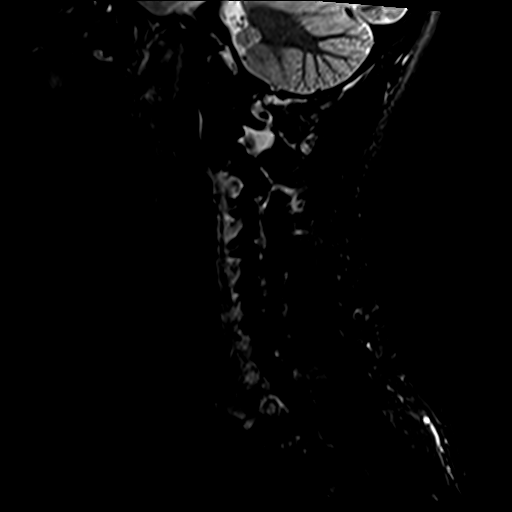
[im 5/15]
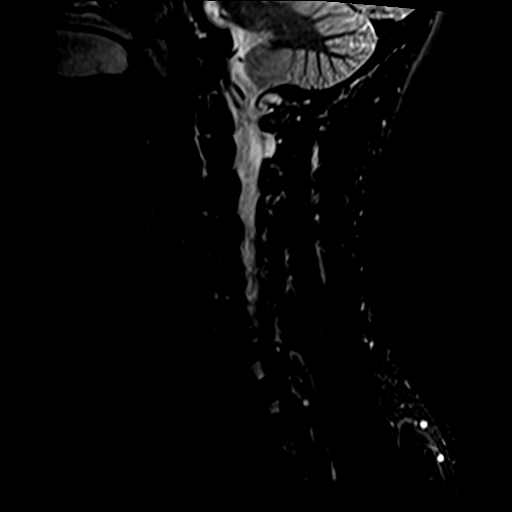
[im 8/15]
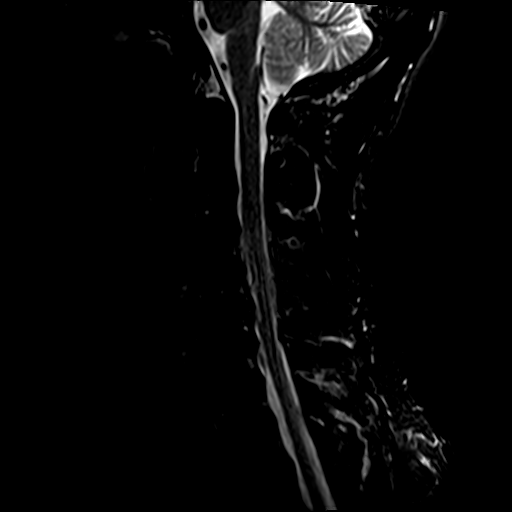
[im 12/15]
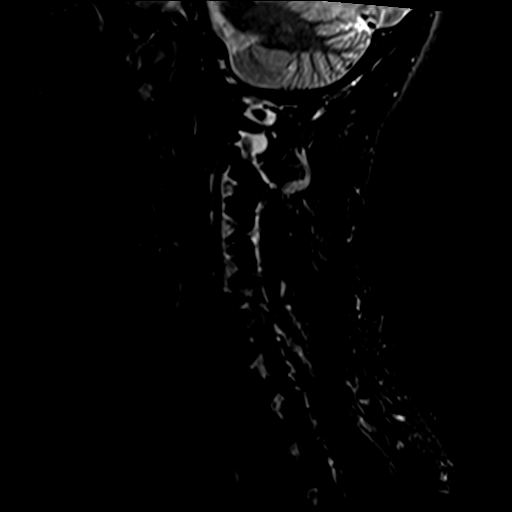

[Series 6: T1 · sagittal · 3.0mm · 0.41mm/px · 7 of 15 slices shown]
[im 1/15]
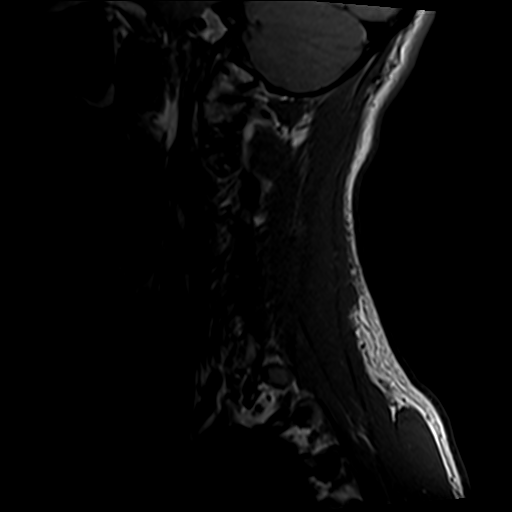
[im 3/15]
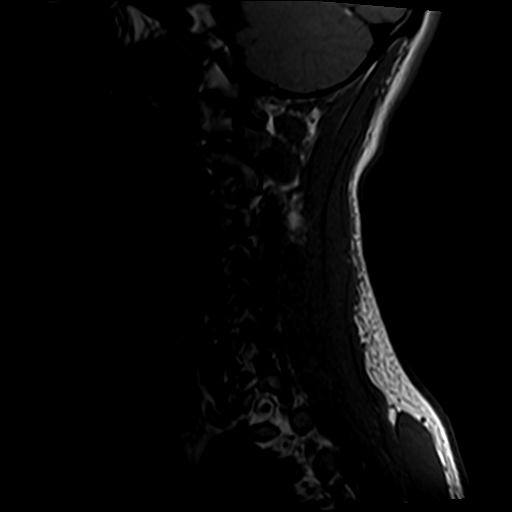
[im 5/15]
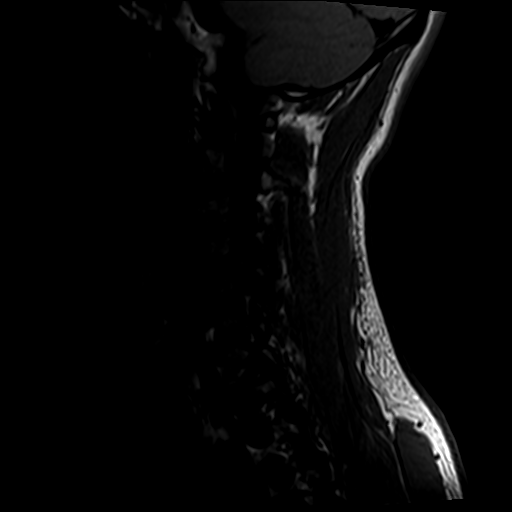
[im 8/15]
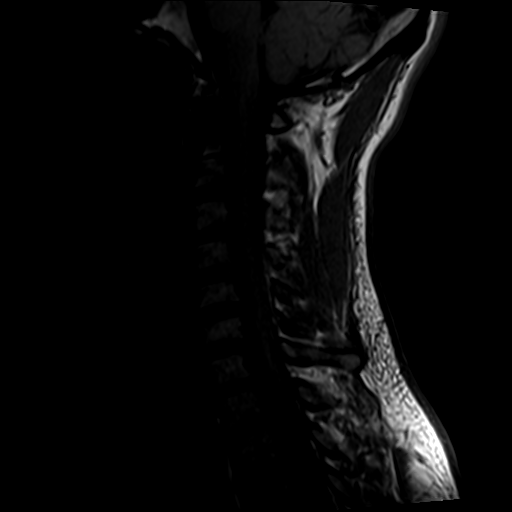
[im 10/15]
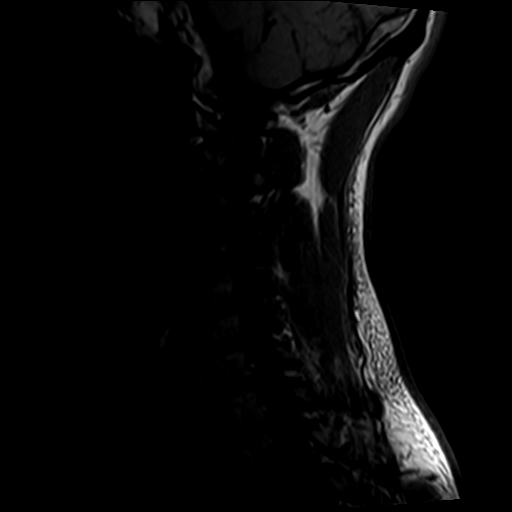
[im 12/15]
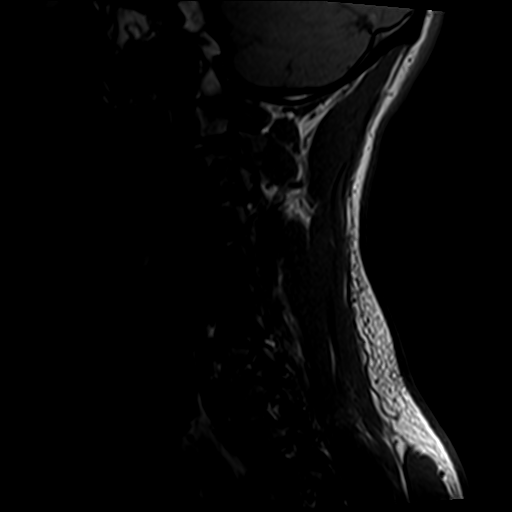
[im 15/15]
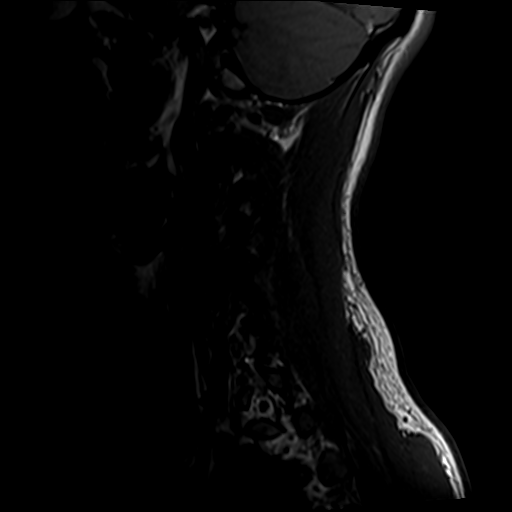

[Series 8: T2 · axial · 3.0mm · 0.70mm/px · z∈[-31,+60]mm · 9 of 26 slices shown (2 of 2)]
[im 1/26]
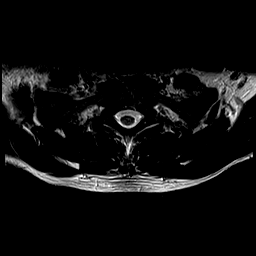
[im 5/26]
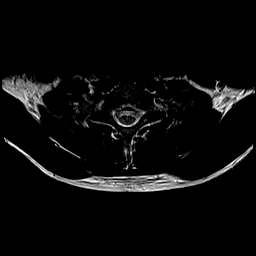
[im 9/26]
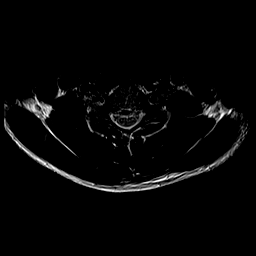
[im 11/26]
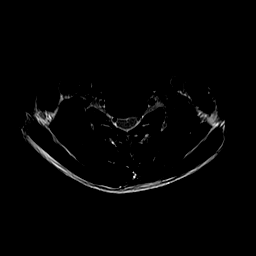
[im 13/26]
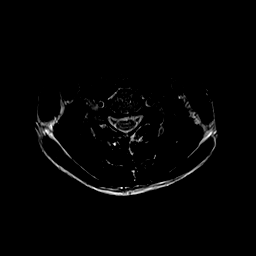
[im 15/26]
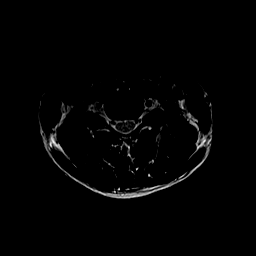
[im 17/26]
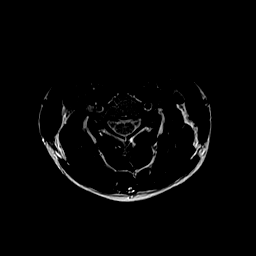
[im 21/26]
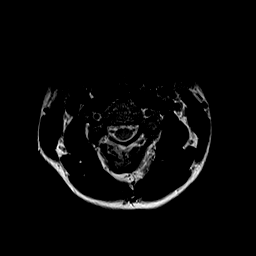
[im 26/26]
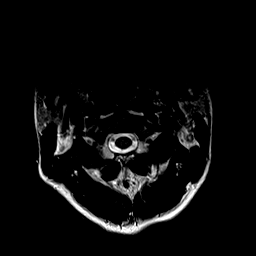

[29 of 48 positions shown; findings below may reference images not displayed]

FINDINGS: Alignment: Straightening of cervical lordosis.

Vertebrae: Normal bone marrow signal intensity. No focal osseous
lesion.

Cord: Normal signal and morphology. Please note that motion artifact
limits evaluation.

Posterior Fossa, vertebral arteries: Negative.

Disc levels: Multilevel desiccation however disc spaces are grossly
preserved.

C2-3: No significant disc bulge, spinal canal or neural foraminal
narrowing.

C3-4: Disc bulge with small superimposed left subarticular/foraminal
protrusion. Left uncovertebral and facet degenerative spurring. Mild
left neural foraminal narrowing.

C4-5: Disc osteophyte complex with superimposed right paracentral
protrusion partially effacing the ventral CSF containing spaces,
uncovertebral and bilateral facet hypertrophy. Mild spinal canal and
bilateral neural foraminal narrowing.

C5-6: Disc osteophyte complex with superimposed right paracentral
protrusion abutting the ventral cord and uncovertebral hypertrophy.
Moderate spinal canal and bilateral neural foraminal narrowing.

C6-7: Disc osteophyte complex with superimposed central protrusion,
uncovertebral and bilateral facet hypertrophy. Mild spinal canal,
moderate right and mild left neural foraminal narrowing.

C7-T1: No significant disc bulge, spinal canal or neural foraminal
narrowing.

Paraspinal tissues: Paraspinal soft tissues within normal limits.
Right maxillary sinus disease.
IMPRESSION: Moderate spinal canal and bilateral neural foraminal narrowing at
the C5-6 level.

Moderate right C6-7 neural foraminal narrowing.

Mild C4-5 and C6-7 spinal canal narrowing.

## 2021-10-13 ENCOUNTER — Ambulatory Visit
Admission: EM | Admit: 2021-10-13 | Discharge: 2021-10-13 | Disposition: A | Discharge status: Home / Self Care | Payer: Self-pay | Attending: Family Medicine | Admitting: Family Medicine

## 2021-10-13 DIAGNOSIS — J069 Acute upper respiratory infection, unspecified: Secondary | ICD-10-CM | POA: Insufficient documentation

## 2021-10-13 DIAGNOSIS — J029 Acute pharyngitis, unspecified: Secondary | ICD-10-CM | POA: Insufficient documentation

## 2021-10-13 LAB — POCT RAPID STREP A (OFFICE): Rapid Strep A Screen: NEGATIVE

## 2021-10-13 MED ORDER — BENZONATATE 100 MG PO CAPS: 100.0000 mg | ORAL_CAPSULE | Freq: Three times a day (TID) | ORAL | 0 refills | Status: AC | PRN

## 2021-10-13 NOTE — Discharge Instructions (Addendum)
Your strep test is negative.  Culture of the throat will be sent, and staff will notify you if that is in turn positive.  Take benzonatate 100 mg, 1 tab every 8 hours as needed for cough.   

## 2021-10-13 NOTE — ED Triage Notes (Signed)
Pt c/o cough, sore throat, headache, nasal drainage, onset Sunday not relieved w/ otc options.

## 2021-10-13 NOTE — ED Provider Notes (Signed)
[facility]    CSN: [id number] Arrival date & time: 10/13/21  1812      History   Chief Complaint Chief Complaint  Patient presents with   Cough    Entered by patient    HPI Henry Armstrong is a 36 y.o. male.    Cough  Here for sore throat and cough that began on June.  No fever so far.  Not much nasal congestion.  No nausea, vomiting or diarrhea.  No loss of taste or smell.  History reviewed. No pertinent past medical history.  There are no problems to display for this patient.   History reviewed. No pertinent surgical history.     Home Medications    Prior to Admission medications   Medication Sig Start Date End Date Taking? Authorizing Provider  benzonatate (TESSALON) 100 MG capsule Take 1 capsule (100 mg total) by mouth 3 (three) times daily as needed for cough. 10/13/21  Yes Zenia Resides, MD    Family History History reviewed. No pertinent family history.  Social History Social History   Tobacco Use   Smoking status: Never   Smokeless tobacco: Never  Substance Use Topics   Alcohol use: Yes    Comment: occ   Drug use: No     Allergies   Patient has no known allergies.   Review of Systems Review of Systems  Respiratory:  Positive for cough.      Physical Exam Triage Vital Signs ED Triage Vitals  Enc Vitals Group     BP 10/13/21 1847 (!) 145/90     Pulse Rate 10/13/21 1847 65     Resp 10/13/21 1847 18     Temp 10/13/21 1847 98 F (36.7 C)     Temp Source 10/13/21 1847 Oral     SpO2 10/13/21 1847 98 %     Weight --      Height --      Head Circumference --      Peak Flow --      Pain Score 10/13/21 1848 0     Pain Loc --      Pain Edu? --      Excl. in GC? --    No data found.  Updated Vital Signs BP (!) 145/90 (BP Location: Left Arm)   Pulse 65   Temp 98 F (36.7 C) (Oral)   Resp 18   SpO2 98%   Visual Acuity Right Eye Distance:   Left Eye Distance:   Bilateral Distance:    Right Eye Near:    Left Eye Near:    Bilateral Near:     Physical Exam Vitals reviewed.  Constitutional:      General: He is not in acute distress.    Appearance: He is not toxic-appearing.  HENT:     Right Ear: Tympanic membrane and ear canal normal.     Left Ear: Tympanic membrane and ear canal normal.     Nose: Nose normal.     Mouth/Throat:     Mouth: Mucous membranes are moist.     Comments: Oropharynx is erythematous and there is erythema of the soft palate.  There is no asymmetry. Eyes:     Extraocular Movements: Extraocular movements intact.     Conjunctiva/sclera: Conjunctivae normal.     Pupils: Pupils are equal, round, and reactive to light.  Cardiovascular:     Rate and Rhythm: Normal rate and regular rhythm.     Heart sounds: No murmur heard. Pulmonary:  Effort: Pulmonary effort is normal.     Breath sounds: Normal breath sounds.  Musculoskeletal:     Cervical back: Neck supple.  Lymphadenopathy:     Cervical: No cervical adenopathy.  Skin:    Capillary Refill: Capillary refill takes less than 2 seconds.     Coloration: Skin is not jaundiced or pale.  Neurological:     General: No focal deficit present.     Mental Status: He is alert and oriented to person, place, and time.  Psychiatric:        Behavior: Behavior normal.      UC Treatments / Results  Labs (all labs ordered are listed, but only abnormal results are displayed) Labs Reviewed  CULTURE, GROUP A STREP [facility])  POCT RAPID STREP A (OFFICE)    EKG   Radiology No results found.  Procedures Procedures (including critical care time)  Medications Ordered in UC Medications - No data to display  Initial Impression / Assessment and Plan / UC Course  I have reviewed the triage vital signs and the nursing notes.  Pertinent labs & imaging results that were available during my care of the patient were reviewed by me and considered in my medical decision making (see chart for details).     Rapid strep is  negative; we will send culture and treat per protocol if positive  I offered covid testing, and he declines Final Clinical Impressions(s) / UC Diagnoses   Final diagnoses:  Sore throat  Viral URI with cough     Discharge Instructions      Your strep test is negative.  Culture of the throat will be sent, and staff will notify you if that is in turn positive.  Take benzonatate 100 mg, 1 tab every 8 hours as needed for cough.       ED Prescriptions     Medication Sig Dispense Auth. Provider   benzonatate (TESSALON) 100 MG capsule Take 1 capsule (100 mg total) by mouth 3 (three) times daily as needed for cough. 21 capsule Zenia Resides, MD      I have reviewed the PDMP during this encounter.   Zenia Resides, MD 10/13/21 [phone]

## 2021-10-17 LAB — CULTURE, GROUP A STREP (THRC)
